# Patient Record
Sex: Female | Born: 1955 | Race: Black or African American | Hispanic: No | Marital: Married | Smoking: Never smoker
Health system: Southern US, Community
[De-identification: ages and names within clinical notes are randomized; demographics above are authoritative.]

## PROBLEM LIST (undated history)

## (undated) DIAGNOSIS — I10 Essential (primary) hypertension: Secondary | ICD-10-CM

## (undated) DIAGNOSIS — E119 Type 2 diabetes mellitus without complications: Secondary | ICD-10-CM

## (undated) HISTORY — PX: ANKLE FRACTURE SURGERY: SHX122

## (undated) HISTORY — PX: DEBRIDEMENT LEG: SUR390

## (undated) HISTORY — PX: ORIF HIP FRACTURE: SHX2125

## (undated) HISTORY — PX: FEMUR FRACTURE SURGERY: SHX633

---

## 2000-10-17 ENCOUNTER — Other Ambulatory Visit: Admission: RE | Admit: 2000-10-17 | Discharge: 2000-10-17 | Payer: Self-pay | Admitting: *Deleted

## 2001-10-23 ENCOUNTER — Other Ambulatory Visit: Admission: RE | Admit: 2001-10-23 | Discharge: 2001-10-23 | Payer: Self-pay | Admitting: Internal Medicine

## 2001-10-29 ENCOUNTER — Encounter: Payer: Self-pay | Admitting: Internal Medicine

## 2001-10-29 ENCOUNTER — Encounter: Admission: RE | Admit: 2001-10-29 | Discharge: 2001-10-29 | Payer: Self-pay | Admitting: Internal Medicine

## 2002-06-18 ENCOUNTER — Encounter: Admission: RE | Admit: 2002-06-18 | Discharge: 2002-09-16 | Payer: Self-pay | Admitting: Internal Medicine

## 2004-05-28 HISTORY — PX: ORIF FOOT FRACTURE: SHX2123

## 2004-05-28 HISTORY — PX: LEG AMPUTATION BELOW KNEE: SHX694

## 2004-10-12 ENCOUNTER — Inpatient Hospital Stay (HOSPITAL_COMMUNITY): Admission: AC | Admit: 2004-10-12 | Discharge: 2004-10-12 | Payer: Self-pay

## 2005-04-30 ENCOUNTER — Encounter: Admission: RE | Admit: 2005-04-30 | Discharge: 2005-07-29 | Payer: Self-pay | Admitting: Orthopaedic Surgery

## 2005-07-30 ENCOUNTER — Encounter: Admission: RE | Admit: 2005-07-30 | Discharge: 2005-10-28 | Payer: Self-pay | Admitting: Orthopaedic Surgery

## 2005-08-15 ENCOUNTER — Ambulatory Visit (HOSPITAL_BASED_OUTPATIENT_CLINIC_OR_DEPARTMENT_OTHER): Admission: RE | Admit: 2005-08-15 | Discharge: 2005-08-15 | Payer: Self-pay | Admitting: Ophthalmology

## 2005-10-08 ENCOUNTER — Encounter: Admission: RE | Admit: 2005-10-08 | Discharge: 2005-10-08 | Payer: Self-pay | Admitting: Internal Medicine

## 2006-02-28 ENCOUNTER — Encounter: Admission: RE | Admit: 2006-02-28 | Discharge: 2006-05-29 | Payer: Self-pay | Admitting: Orthopaedic Surgery
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
## 2006-05-30 ENCOUNTER — Encounter: Admission: RE | Admit: 2006-05-30 | Discharge: 2006-08-28 | Payer: Self-pay | Admitting: Orthopaedic Surgery

## 2006-08-20 ENCOUNTER — Encounter: Admission: RE | Admit: 2006-08-20 | Discharge: 2006-08-20 | Payer: Self-pay | Admitting: Orthopaedic Surgery

## 2006-11-15 ENCOUNTER — Encounter: Admission: RE | Admit: 2006-11-15 | Discharge: 2006-11-15 | Payer: Self-pay | Admitting: Internal Medicine

## 2006-12-24 ENCOUNTER — Emergency Department (HOSPITAL_COMMUNITY): Admission: EM | Admit: 2006-12-24 | Discharge: 2006-12-24 | Payer: Self-pay | Admitting: Family Medicine

## 2008-02-26 ENCOUNTER — Encounter: Admission: RE | Admit: 2008-02-26 | Discharge: 2008-02-26 | Payer: Self-pay | Admitting: Internal Medicine

## 2009-05-02 ENCOUNTER — Ambulatory Visit (HOSPITAL_BASED_OUTPATIENT_CLINIC_OR_DEPARTMENT_OTHER): Admission: RE | Admit: 2009-05-02 | Discharge: 2009-05-02 | Payer: Self-pay | Admitting: Ophthalmology

## 2009-05-18 ENCOUNTER — Encounter: Admission: RE | Admit: 2009-05-18 | Discharge: 2009-05-18 | Payer: Self-pay | Admitting: Internal Medicine

## 2010-09-08 ENCOUNTER — Other Ambulatory Visit: Payer: Self-pay | Admitting: Internal Medicine

## 2010-09-08 DIAGNOSIS — Z1231 Encounter for screening mammogram for malignant neoplasm of breast: Secondary | ICD-10-CM

## 2010-09-14 ENCOUNTER — Ambulatory Visit
Admission: RE | Admit: 2010-09-14 | Discharge: 2010-09-14 | Disposition: A | Discharge status: Home / Self Care | Payer: 59 | Source: Ambulatory Visit | Attending: Internal Medicine | Admitting: Internal Medicine

## 2010-09-14 DIAGNOSIS — Z1231 Encounter for screening mammogram for malignant neoplasm of breast: Secondary | ICD-10-CM

## 2010-10-13 NOTE — Op Note (Signed)
NAME:  Heather Bowen, ALEN NO.:  [id number]   MEDICAL RECORD NO.:  [id number]          PATIENT TYPE:  AMB   LOCATION:  DSC                          FACILITY:  [facility]   PHYSICIAN:  Salley Scarlet., M.D.DATE OF BIRTH:  10/07/1955   DATE OF PROCEDURE:  DATE OF DISCHARGE:  08/15/2005                                 OPERATIVE REPORT   PREOPERATIVE DIAGNOSIS:  Chalazion upper lid left eye.   POSTOPERATIVE DIAGNOSIS:  Chalazion upper lid left eye.   OPERATION:  Chalazion excision.   ANESTHESIA:  Local using Xylocaine 1%.   DESCRIPTION OF PROCEDURE:  The upper lid of the left eye was inspected and  there was a large mass located along the lateral aspect of the upper lid of  the left eye.  The lid was prepped and draped in the usual manner.  The lid  was then infiltrated with several mL's of Xylocaine.  The chalazion clamp  was applied over the lesion and the lid was everted.  A cruciate incission  was made in the tarsus of the of the lid over the lesion and a large amount  of pus expressed itself from the wound.  The lesion was curetted using  chalazion curette.  The sac was incised thoroughly using sharp and blunt  dissection.  Polysporin ophthalmic ointment and a pressure patch was  applied.  The patient tolerated the procedure well and was discharged to the  post anesthesia care unit in satisfactory condition.  She is instructed to  take Vicodin today, to resume her drops tomorrow after she removes the patch  and see me in the office in one week.   DISCHARGE DIAGNOSIS:  Chalazion upper lid, left eye.      Salley Scarlet., M.D.  Electronically Signed     TB/MEDQ  D:  08/17/2005  T:  08/19/2005  Job:  [id number]
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
## 2011-04-17 DIAGNOSIS — M009 Pyogenic arthritis, unspecified: Secondary | ICD-10-CM | POA: Insufficient documentation

## 2011-05-02 DIAGNOSIS — E119 Type 2 diabetes mellitus without complications: Secondary | ICD-10-CM | POA: Insufficient documentation

## 2011-10-18 ENCOUNTER — Other Ambulatory Visit: Payer: Self-pay | Admitting: Internal Medicine

## 2011-10-18 DIAGNOSIS — Z1231 Encounter for screening mammogram for malignant neoplasm of breast: Secondary | ICD-10-CM

## 2011-10-29 ENCOUNTER — Ambulatory Visit
Admission: RE | Admit: 2011-10-29 | Discharge: 2011-10-29 | Disposition: A | Discharge status: Home / Self Care | Payer: 59 | Source: Ambulatory Visit | Attending: Internal Medicine | Admitting: Internal Medicine

## 2011-10-29 DIAGNOSIS — Z1231 Encounter for screening mammogram for malignant neoplasm of breast: Secondary | ICD-10-CM

## 2013-02-20 DIAGNOSIS — M25559 Pain in unspecified hip: Secondary | ICD-10-CM | POA: Insufficient documentation

## 2013-03-12 DIAGNOSIS — M545 Low back pain, unspecified: Secondary | ICD-10-CM | POA: Insufficient documentation

## 2013-03-12 DIAGNOSIS — M5136 Other intervertebral disc degeneration, lumbar region: Secondary | ICD-10-CM | POA: Insufficient documentation

## 2013-11-17 ENCOUNTER — Emergency Department (INDEPENDENT_AMBULATORY_CARE_PROVIDER_SITE_OTHER)
Admission: EM | Admit: 2013-11-17 | Discharge: 2013-11-17 | Disposition: A | Discharge status: Home / Self Care | Payer: 59 | Source: Home / Self Care | Attending: Family Medicine | Admitting: Family Medicine

## 2013-11-17 ENCOUNTER — Encounter (HOSPITAL_COMMUNITY): Payer: Self-pay | Admitting: Emergency Medicine

## 2013-11-17 DIAGNOSIS — J069 Acute upper respiratory infection, unspecified: Secondary | ICD-10-CM

## 2013-11-17 HISTORY — DX: Essential (primary) hypertension: I10

## 2013-11-17 HISTORY — DX: Type 2 diabetes mellitus without complications: E11.9

## 2013-11-17 MED ORDER — DOXYCYCLINE HYCLATE 100 MG PO CAPS: 100.0000 mg | ORAL_CAPSULE | Freq: Two times a day (BID) | ORAL | Status: DC

## 2013-11-17 NOTE — ED Notes (Signed)
C/o aching, sore throat, post nasal drip, dry cough and feels tired onset 2 weeks ago.

## 2013-11-17 NOTE — Discharge Instructions (Signed)
Upper Respiratory Infection, Adult An upper respiratory infection (URI) is also sometimes known as the common cold. The upper respiratory tract includes the nose, sinuses, throat, trachea, and bronchi. Bronchi are the airways leading to the lungs. Most people improve within 1 week, but symptoms can last up to 2 weeks. A residual cough may last even longer.  CAUSES Many different viruses can infect the tissues lining the upper respiratory tract. The tissues become irritated and inflamed and often become very moist. Mucus production is also common. A cold is contagious. You can easily spread the virus to others by oral contact. This includes kissing, sharing a glass, coughing, or sneezing. Touching your mouth or nose and then touching a surface, which is then touched by another person, can also spread the virus. SYMPTOMS  Symptoms typically develop 1 to 3 days after you come in contact with a cold virus. Symptoms vary from person to person. They may include:  Runny nose.  Sneezing.  Nasal congestion.  Sinus irritation.  Sore throat.  Loss of voice (laryngitis).  Cough.  Fatigue.  Muscle aches.  Loss of appetite.  Headache.  Low-grade fever. DIAGNOSIS  You might diagnose your own cold based on familiar symptoms, since most people get a cold 2 to 3 times a year. Your caregiver can confirm this based on your exam. Most importantly, your caregiver can check that your symptoms are not due to another disease such as strep throat, sinusitis, pneumonia, asthma, or epiglottitis. Blood tests, throat tests, and X-rays are not necessary to diagnose a common cold, but they may sometimes be helpful in excluding other more serious diseases. Your caregiver will decide if any further tests are required. RISKS AND COMPLICATIONS  You may be at risk for a more severe case of the common cold if you smoke cigarettes, have chronic heart disease (such as heart failure) or lung disease (such as asthma), or if  you have a weakened immune system. The very young and very old are also at risk for more serious infections. Bacterial sinusitis, middle ear infections, and bacterial pneumonia can complicate the common cold. The common cold can worsen asthma and chronic obstructive pulmonary disease (COPD). Sometimes, these complications can require emergency medical care and may be life-threatening. PREVENTION  The best way to protect against getting a cold is to practice good hygiene. Avoid oral or hand contact with people with cold symptoms. Wash your hands often if contact occurs. There is no clear evidence that vitamin C, vitamin E, echinacea, or exercise reduces the chance of developing a cold. However, it is always recommended to get plenty of rest and practice good nutrition. TREATMENT  Treatment is directed at relieving symptoms. There is no cure. Antibiotics are not effective, because the infection is caused by a virus, not by bacteria. Treatment may include:  Increased fluid intake. Sports drinks offer valuable electrolytes, sugars, and fluids.  Breathing heated mist or steam (vaporizer or shower).  Eating chicken soup or other clear broths, and maintaining good nutrition.  Getting plenty of rest.  Using gargles or lozenges for comfort.  Controlling fevers with ibuprofen or acetaminophen as directed by your caregiver.  Increasing usage of your inhaler if you have asthma. Zinc gel and zinc lozenges, taken in the first 24 hours of the common cold, can shorten the duration and lessen the severity of symptoms. Pain medicines may help with fever, muscle aches, and throat pain. A variety of non-prescription medicines are available to treat congestion and runny nose. Your caregiver   can make recommendations and may suggest nasal or lung inhalers for other symptoms.  HOME CARE INSTRUCTIONS   Only take over-the-counter or prescription medicines for pain, discomfort, or fever as directed by your  caregiver.  Use a warm mist humidifier or inhale steam from a shower to increase air moisture. This may keep secretions moist and make it easier to breathe.  Drink enough water and fluids to keep your urine clear or pale yellow.  Rest as needed.  Return to work when your temperature has returned to normal or as your caregiver advises. You may need to stay home longer to avoid infecting others. You can also use a face mask and careful hand washing to prevent spread of the virus. SEEK MEDICAL CARE IF:   After the first few days, you feel you are getting worse rather than better.  You need your caregiver's advice about medicines to control symptoms.  You develop chills, worsening shortness of breath, or brown or red sputum. These may be signs of pneumonia.  You develop yellow or brown nasal discharge or pain in the face, especially when you bend forward. These may be signs of sinusitis.  You develop a fever, swollen neck glands, pain with swallowing, or white areas in the back of your throat. These may be signs of strep throat. SEEK IMMEDIATE MEDICAL CARE IF:   You have a fever.  You develop severe or persistent headache, ear pain, sinus pain, or chest pain.  You develop wheezing, a prolonged cough, cough up blood, or have a change in your usual mucus (if you have chronic lung disease).  You develop sore muscles or a stiff neck. Document Released: 11/07/2000 Document Revised: 08/06/2011 Document Reviewed: 09/15/2010 Exit[employer] Patient Information 2015 [employer]. This information is not intended to replace advice given to you by your health care provider. Make sure you discuss any questions you have with your health care provider.  
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
## 2013-11-20 NOTE — ED Provider Notes (Signed)
Medical screening examination/treatment/procedure(s) were performed by resident physician or non-physician practitioner and as supervising physician I was immediately available for consultation/collaboration.   KINDL,JAMES DOUGLAS MD.   James D Kindl, MD 11/20/13 1008 

## 2014-04-21 ENCOUNTER — Other Ambulatory Visit: Payer: Self-pay

## 2014-04-21 DIAGNOSIS — Z1231 Encounter for screening mammogram for malignant neoplasm of breast: Secondary | ICD-10-CM

## 2014-05-13 ENCOUNTER — Ambulatory Visit
Admission: RE | Admit: 2014-05-13 | Discharge: 2014-05-13 | Disposition: A | Discharge status: Home / Self Care | Payer: BC Managed Care – PPO | Source: Ambulatory Visit

## 2014-05-13 DIAGNOSIS — Z1231 Encounter for screening mammogram for malignant neoplasm of breast: Secondary | ICD-10-CM

## 2015-02-11 ENCOUNTER — Encounter (HOSPITAL_COMMUNITY): Payer: Self-pay | Admitting: *Deleted

## 2015-02-11 ENCOUNTER — Emergency Department (HOSPITAL_COMMUNITY)
Admission: EM | Admit: 2015-02-11 | Discharge: 2015-02-11 | Disposition: A | Discharge status: Home / Self Care | Payer: BLUE CROSS/BLUE SHIELD | Source: Home / Self Care | Attending: Family Medicine | Admitting: Family Medicine

## 2015-02-11 DIAGNOSIS — L810 Postinflammatory hyperpigmentation: Secondary | ICD-10-CM | POA: Diagnosis not present

## 2015-02-11 DIAGNOSIS — J069 Acute upper respiratory infection, unspecified: Secondary | ICD-10-CM

## 2015-02-11 MED ORDER — IPRATROPIUM BROMIDE 0.06 % NA SOLN: 2.0000 | Freq: Four times a day (QID) | NASAL | Status: DC

## 2015-02-11 MED ORDER — CETIRIZINE HCL 10 MG PO TABS: 10.0000 mg | ORAL_TABLET | Freq: Every day | ORAL | Status: DC

## 2015-02-11 MED ORDER — FLUTICASONE PROPIONATE 0.05 % EX CREA: TOPICAL_CREAM | Freq: Two times a day (BID) | CUTANEOUS | Status: DC
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
## 2015-02-11 NOTE — ED Notes (Signed)
Pt  Reports    Symptoms    Of  A  Knot  On the  l  Arm     X    1  Week       She reports  Some  Drainage  friom the  Affected  Arm     Pt    Reports  Symptoms  Of  Sinus  Drainage         And  Congestion   As  Well        Symptoms  Not releived  By  Charna Elizabeth

## 2015-08-30 DIAGNOSIS — E7800 Pure hypercholesterolemia, unspecified: Secondary | ICD-10-CM | POA: Diagnosis not present

## 2015-08-30 DIAGNOSIS — E1165 Type 2 diabetes mellitus with hyperglycemia: Secondary | ICD-10-CM | POA: Diagnosis not present

## 2015-09-01 DIAGNOSIS — E7800 Pure hypercholesterolemia, unspecified: Secondary | ICD-10-CM | POA: Diagnosis not present

## 2015-09-01 DIAGNOSIS — I10 Essential (primary) hypertension: Secondary | ICD-10-CM | POA: Diagnosis not present

## 2015-09-01 DIAGNOSIS — E1165 Type 2 diabetes mellitus with hyperglycemia: Secondary | ICD-10-CM | POA: Diagnosis not present

## 2015-10-03 DIAGNOSIS — Z8739 Personal history of other diseases of the musculoskeletal system and connective tissue: Secondary | ICD-10-CM | POA: Diagnosis not present

## 2015-10-03 DIAGNOSIS — Z89511 Acquired absence of right leg below knee: Secondary | ICD-10-CM | POA: Diagnosis not present

## 2015-10-03 DIAGNOSIS — Z09 Encounter for follow-up examination after completed treatment for conditions other than malignant neoplasm: Secondary | ICD-10-CM | POA: Diagnosis not present

## 2015-10-13 DIAGNOSIS — Z89511 Acquired absence of right leg below knee: Secondary | ICD-10-CM | POA: Diagnosis not present

## 2015-10-13 DIAGNOSIS — M5136 Other intervertebral disc degeneration, lumbar region: Secondary | ICD-10-CM | POA: Diagnosis not present

## 2015-10-13 DIAGNOSIS — R269 Unspecified abnormalities of gait and mobility: Secondary | ICD-10-CM | POA: Diagnosis not present

## 2015-10-20 ENCOUNTER — Ambulatory Visit: Payer: BLUE CROSS/BLUE SHIELD | Attending: Orthopaedic Surgery | Admitting: Physical Therapy

## 2015-10-20 ENCOUNTER — Encounter: Payer: Self-pay | Admitting: Physical Therapy

## 2015-10-20 DIAGNOSIS — M2560 Stiffness of unspecified joint, not elsewhere classified: Secondary | ICD-10-CM | POA: Diagnosis not present

## 2015-10-20 DIAGNOSIS — R2689 Other abnormalities of gait and mobility: Secondary | ICD-10-CM

## 2015-10-20 DIAGNOSIS — M25661 Stiffness of right knee, not elsewhere classified: Secondary | ICD-10-CM

## 2015-10-20 DIAGNOSIS — M25672 Stiffness of left ankle, not elsewhere classified: Secondary | ICD-10-CM

## 2015-10-20 DIAGNOSIS — M6281 Muscle weakness (generalized): Secondary | ICD-10-CM

## 2015-10-20 DIAGNOSIS — R2681 Unsteadiness on feet: Secondary | ICD-10-CM

## 2015-10-20 DIAGNOSIS — Z1211 Encounter for screening for malignant neoplasm of colon: Secondary | ICD-10-CM | POA: Diagnosis not present

## 2015-10-20 DIAGNOSIS — M2569 Stiffness of other specified joint, not elsewhere classified: Secondary | ICD-10-CM

## 2015-10-20 DIAGNOSIS — K5900 Constipation, unspecified: Secondary | ICD-10-CM | POA: Diagnosis not present

## 2015-10-20 DIAGNOSIS — M25651 Stiffness of right hip, not elsewhere classified: Secondary | ICD-10-CM | POA: Diagnosis not present
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
## 2015-10-26 ENCOUNTER — Encounter: Payer: Self-pay | Admitting: Physical Therapy

## 2015-10-26 ENCOUNTER — Ambulatory Visit: Payer: BLUE CROSS/BLUE SHIELD | Admitting: Physical Therapy

## 2015-10-26 DIAGNOSIS — R2681 Unsteadiness on feet: Secondary | ICD-10-CM | POA: Diagnosis not present

## 2015-10-26 DIAGNOSIS — M25651 Stiffness of right hip, not elsewhere classified: Secondary | ICD-10-CM

## 2015-10-26 DIAGNOSIS — M6281 Muscle weakness (generalized): Secondary | ICD-10-CM | POA: Diagnosis not present

## 2015-10-26 DIAGNOSIS — M25661 Stiffness of right knee, not elsewhere classified: Secondary | ICD-10-CM | POA: Diagnosis not present

## 2015-10-26 DIAGNOSIS — M25672 Stiffness of left ankle, not elsewhere classified: Secondary | ICD-10-CM

## 2015-10-26 DIAGNOSIS — M2569 Stiffness of other specified joint, not elsewhere classified: Secondary | ICD-10-CM

## 2015-10-26 DIAGNOSIS — M2560 Stiffness of unspecified joint, not elsewhere classified: Secondary | ICD-10-CM | POA: Diagnosis not present

## 2015-10-26 DIAGNOSIS — R2689 Other abnormalities of gait and mobility: Secondary | ICD-10-CM | POA: Diagnosis not present
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
## 2015-10-26 NOTE — Patient Instructions (Addendum)
Bridge    Lift hips, keeping pelvis level. Hold for 5 seconds. Do _10_ times, _1-2__ times per day.  http://ss.exer.us/365   Copyright  VHI. All rights reserved.   Lower Torso    Lie supine with knees flexed over ball. Rotate knees to one side, holding onto ball. Hold _15__ seconds. Repeat to other side. Do _1__ sets of _3__ repetitions each way. Copyright  VHI. All rights reserved.  Chair Sitting    Sit at edge of seat, spine straight, one leg extended. Put a hand on each thigh and bend forward from the hip, keeping spine straight. Allow hand on extended leg to reach toward toes. Support upper body with other arm. Hold _20__ seconds. Repeat _3_ times per session. Do _2__ sessions per day.  Copyright  VHI. All rights reserved.   Triangle    Modified: in sitting (on sturdy surface when alone, on stability ball between sofa and coffee table for added core challenge) - prop forearm on same leg, reach up to ceiling with other arm, looking at hand for upper body rotation. Hold for 3 long/slow breaths. Perform x 4-5 reps on each side.  Copyright  VHI. All rights reserved.   ruCalf Stretch    Place one leg forward, bent, other leg behind and straight. Lean forward keeping back heel flat. Hold __20__ seconds while counting out loud. Repeat with other leg forward. Repeat __3__ times each leg forward. Do __1-2__ sessions per day.  http://gt2.exer.us/478   Copyright  VHI. All rights reserved.

## 2015-10-28 ENCOUNTER — Encounter: Payer: Self-pay | Admitting: Physical Therapy

## 2015-10-28 ENCOUNTER — Ambulatory Visit: Payer: BLUE CROSS/BLUE SHIELD | Attending: Orthopaedic Surgery | Admitting: Physical Therapy

## 2015-10-28 DIAGNOSIS — R2681 Unsteadiness on feet: Secondary | ICD-10-CM | POA: Diagnosis not present

## 2015-10-28 DIAGNOSIS — M25661 Stiffness of right knee, not elsewhere classified: Secondary | ICD-10-CM | POA: Diagnosis not present

## 2015-10-28 DIAGNOSIS — M25672 Stiffness of left ankle, not elsewhere classified: Secondary | ICD-10-CM | POA: Insufficient documentation

## 2015-10-28 DIAGNOSIS — M6281 Muscle weakness (generalized): Secondary | ICD-10-CM | POA: Diagnosis not present

## 2015-10-28 DIAGNOSIS — R2689 Other abnormalities of gait and mobility: Secondary | ICD-10-CM | POA: Insufficient documentation

## 2015-10-28 DIAGNOSIS — M25651 Stiffness of right hip, not elsewhere classified: Secondary | ICD-10-CM | POA: Diagnosis not present

## 2015-10-28 DIAGNOSIS — M2560 Stiffness of unspecified joint, not elsewhere classified: Secondary | ICD-10-CM | POA: Insufficient documentation

## 2015-10-28 DIAGNOSIS — M2569 Stiffness of other specified joint, not elsewhere classified: Secondary | ICD-10-CM

## 2015-10-28 NOTE — Patient Instructions (Signed)
HIP: Flexion / KNEE: Extension, Straight Leg Raise    Bend one leg up, then raise straight leg up for 3-4 inches, keeping knee straight. Hold in air for 5 seconds. Slowly lower raised leg back down. 10 times each leg. 1-2 times a day  Copyright  VHI. All rights reserved.    Abduction: Clam (Eccentric) - Side-Lying    Lie on side with knees bent as much as you can with feet together.Have red band tied around legs just above knees. Lift top knee, keeping feet together. Keep trunk steady. Slowly lower for 3-5 seconds. _10_ reps per set, _1_ sets per day,  1-2 times a day  http://ecce.exer.us/65   Copyright  VHI. All rights reserved.   Abduction: Side Leg Lift (Eccentric) - Side-Lying    Lie on side. Lift top leg slightly higher than shoulder level. Keep top leg straight with body, toes pointing forward. Slowly lower for 3-5 seconds. _10__ reps each leg, _1_ sets per day, 1-2 times a day. http://ecce.exer.us/63   Copyright  VHI. All rights reserved.    FLEXION: Sitting (Active)    Sit with right leg extended. Bend knee and draw foot backward (have foot on wax paper so it will slide easier). Once you reach the maximum bend you can do, have someone gently push for more bend or use your left leg over top of right leg to push bend farther.  Hold each bend for 10 seconds. Complete __1_ sets of _10__ repetitions. Perform _1-2__ sessions per day.  Copyright  VHI. All rights reserved.   Chair Knee Flexion    Keeping right foot planted on floor, slide hips forward, bending knee as much possible. Hold __10__ seconds. Repeat __10__ times. Do _1-2_ sessions a day.  http://gt2.exer.us/304   Copyright  VHI. All rights reserved.
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
## 2015-11-01 ENCOUNTER — Ambulatory Visit: Payer: BLUE CROSS/BLUE SHIELD | Admitting: Physical Therapy

## 2015-11-01 DIAGNOSIS — M6281 Muscle weakness (generalized): Secondary | ICD-10-CM

## 2015-11-01 DIAGNOSIS — M25661 Stiffness of right knee, not elsewhere classified: Secondary | ICD-10-CM | POA: Diagnosis not present

## 2015-11-01 DIAGNOSIS — R2681 Unsteadiness on feet: Secondary | ICD-10-CM

## 2015-11-01 DIAGNOSIS — M2560 Stiffness of unspecified joint, not elsewhere classified: Secondary | ICD-10-CM | POA: Diagnosis not present

## 2015-11-01 DIAGNOSIS — M25651 Stiffness of right hip, not elsewhere classified: Secondary | ICD-10-CM | POA: Diagnosis not present

## 2015-11-01 DIAGNOSIS — R2689 Other abnormalities of gait and mobility: Secondary | ICD-10-CM | POA: Diagnosis not present

## 2015-11-01 DIAGNOSIS — M25672 Stiffness of left ankle, not elsewhere classified: Secondary | ICD-10-CM | POA: Diagnosis not present

## 2015-11-01 NOTE — Patient Instructions (Addendum)
Adduction / Internal Rotation: ROM (Supine)    Position (A) Patient: Bend left leg, foot flat on bed. Helper: Stabilize hip. Place other hand on knee. Motion (B) -Push knee toward opposite side of body. -Stop at point of tension. Repeat _2-3__ times. Repeat with other leg. Do _2__ sessions per day. Variation: Can add ball and sheet to help roll   Copyright  VHI. All rights reserved.  Abduction / External Rotation: ROM (Supine)    Position (A) Patient: Bend left leg, foot flat on bed. Helper: Stabilize hip. Place other hand on knee. Motion (B) -Move knee out to side, pelvis remains flat on bed. -Stop at point of tension in inner thigh. Repeat ___ times. Repeat with other leg. Do ___ sessions per day.   Copyright  VHI. All rights reserved.    Hip Flexors (Supine)    Rock back with both knees up, Lie with both legs bent over edge of firm surface. To stretch left hip, bring opposite knee to chest. Apply downward pressure to leg hanging over edge. Do not allow hips to roll up. Do not let knees change position.   Copyright  VHI. All rights reserved.  Hip Abduction / Adduction: with Extended Knee (Supine)    Bring left leg out to side and return. Keep knee straight. Repeat ____ times per set. Do ____ sets per session. Do ____ sessions per day.  http://orth.exer.us/681   Copyright  VHI. All rights reserved.
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
## 2015-11-02 ENCOUNTER — Ambulatory Visit: Payer: BLUE CROSS/BLUE SHIELD | Admitting: Physical Therapy

## 2015-11-02 DIAGNOSIS — R2689 Other abnormalities of gait and mobility: Secondary | ICD-10-CM | POA: Diagnosis not present

## 2015-11-02 DIAGNOSIS — M25661 Stiffness of right knee, not elsewhere classified: Secondary | ICD-10-CM | POA: Diagnosis not present

## 2015-11-02 DIAGNOSIS — M6281 Muscle weakness (generalized): Secondary | ICD-10-CM

## 2015-11-02 DIAGNOSIS — R2681 Unsteadiness on feet: Secondary | ICD-10-CM

## 2015-11-02 DIAGNOSIS — M25651 Stiffness of right hip, not elsewhere classified: Secondary | ICD-10-CM | POA: Diagnosis not present

## 2015-11-02 DIAGNOSIS — M2560 Stiffness of unspecified joint, not elsewhere classified: Secondary | ICD-10-CM | POA: Diagnosis not present

## 2015-11-02 DIAGNOSIS — M25672 Stiffness of left ankle, not elsewhere classified: Secondary | ICD-10-CM | POA: Diagnosis not present
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
## 2015-11-08 ENCOUNTER — Encounter: Payer: Self-pay | Admitting: Physical Therapy

## 2015-11-08 ENCOUNTER — Ambulatory Visit: Payer: BLUE CROSS/BLUE SHIELD | Admitting: Physical Therapy

## 2015-11-08 DIAGNOSIS — M25651 Stiffness of right hip, not elsewhere classified: Secondary | ICD-10-CM | POA: Diagnosis not present

## 2015-11-08 DIAGNOSIS — M6281 Muscle weakness (generalized): Secondary | ICD-10-CM

## 2015-11-08 DIAGNOSIS — M2560 Stiffness of unspecified joint, not elsewhere classified: Secondary | ICD-10-CM

## 2015-11-08 DIAGNOSIS — M2569 Stiffness of other specified joint, not elsewhere classified: Secondary | ICD-10-CM

## 2015-11-08 DIAGNOSIS — R2681 Unsteadiness on feet: Secondary | ICD-10-CM

## 2015-11-08 DIAGNOSIS — M25672 Stiffness of left ankle, not elsewhere classified: Secondary | ICD-10-CM | POA: Diagnosis not present

## 2015-11-08 DIAGNOSIS — M25661 Stiffness of right knee, not elsewhere classified: Secondary | ICD-10-CM

## 2015-11-08 DIAGNOSIS — R2689 Other abnormalities of gait and mobility: Secondary | ICD-10-CM

## 2015-11-10 ENCOUNTER — Ambulatory Visit: Payer: BLUE CROSS/BLUE SHIELD | Admitting: Physical Therapy

## 2015-11-10 DIAGNOSIS — M25661 Stiffness of right knee, not elsewhere classified: Secondary | ICD-10-CM | POA: Diagnosis not present

## 2015-11-10 DIAGNOSIS — M25672 Stiffness of left ankle, not elsewhere classified: Secondary | ICD-10-CM | POA: Diagnosis not present

## 2015-11-10 DIAGNOSIS — R2689 Other abnormalities of gait and mobility: Secondary | ICD-10-CM | POA: Diagnosis not present

## 2015-11-10 DIAGNOSIS — R2681 Unsteadiness on feet: Secondary | ICD-10-CM

## 2015-11-10 DIAGNOSIS — M2560 Stiffness of unspecified joint, not elsewhere classified: Secondary | ICD-10-CM | POA: Diagnosis not present

## 2015-11-10 DIAGNOSIS — M6281 Muscle weakness (generalized): Secondary | ICD-10-CM | POA: Diagnosis not present

## 2015-11-10 DIAGNOSIS — M25651 Stiffness of right hip, not elsewhere classified: Secondary | ICD-10-CM | POA: Diagnosis not present
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
## 2015-11-15 ENCOUNTER — Ambulatory Visit: Payer: BLUE CROSS/BLUE SHIELD | Admitting: Physical Therapy

## 2015-11-15 ENCOUNTER — Encounter: Payer: Self-pay | Admitting: Physical Therapy

## 2015-11-15 DIAGNOSIS — R2681 Unsteadiness on feet: Secondary | ICD-10-CM | POA: Diagnosis not present

## 2015-11-15 DIAGNOSIS — M25651 Stiffness of right hip, not elsewhere classified: Secondary | ICD-10-CM | POA: Diagnosis not present

## 2015-11-15 DIAGNOSIS — M2560 Stiffness of unspecified joint, not elsewhere classified: Secondary | ICD-10-CM

## 2015-11-15 DIAGNOSIS — R2689 Other abnormalities of gait and mobility: Secondary | ICD-10-CM

## 2015-11-15 DIAGNOSIS — M2569 Stiffness of other specified joint, not elsewhere classified: Secondary | ICD-10-CM

## 2015-11-15 DIAGNOSIS — M25661 Stiffness of right knee, not elsewhere classified: Secondary | ICD-10-CM

## 2015-11-15 DIAGNOSIS — M6281 Muscle weakness (generalized): Secondary | ICD-10-CM | POA: Diagnosis not present

## 2015-11-15 DIAGNOSIS — M25672 Stiffness of left ankle, not elsewhere classified: Secondary | ICD-10-CM | POA: Diagnosis not present
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
## 2015-11-17 ENCOUNTER — Ambulatory Visit: Payer: BLUE CROSS/BLUE SHIELD | Admitting: Physical Therapy

## 2015-11-17 DIAGNOSIS — M25661 Stiffness of right knee, not elsewhere classified: Secondary | ICD-10-CM | POA: Diagnosis not present

## 2015-11-17 DIAGNOSIS — M2560 Stiffness of unspecified joint, not elsewhere classified: Secondary | ICD-10-CM | POA: Diagnosis not present

## 2015-11-17 DIAGNOSIS — R2681 Unsteadiness on feet: Secondary | ICD-10-CM | POA: Diagnosis not present

## 2015-11-17 DIAGNOSIS — R2689 Other abnormalities of gait and mobility: Secondary | ICD-10-CM | POA: Diagnosis not present

## 2015-11-17 DIAGNOSIS — M6281 Muscle weakness (generalized): Secondary | ICD-10-CM | POA: Diagnosis not present

## 2015-11-17 DIAGNOSIS — Z89511 Acquired absence of right leg below knee: Secondary | ICD-10-CM | POA: Diagnosis not present

## 2015-11-17 DIAGNOSIS — M25651 Stiffness of right hip, not elsewhere classified: Secondary | ICD-10-CM | POA: Diagnosis not present

## 2015-11-17 DIAGNOSIS — M25672 Stiffness of left ankle, not elsewhere classified: Secondary | ICD-10-CM | POA: Diagnosis not present

## 2015-11-17 NOTE — Patient Instructions (Signed)
Feet Together (Compliant Surface) Head Motion - Eyes Open    With EYES  OPEN, standing on compliant surface: feet together, move head slowly: up and down. Repeat _2_ times per session. Do _3__ sessions per day.  Copyright  VHI. All rights reserved.  Feet Together, Head Motion - Eyes Closed    With eyes closed and feet together, move head slowly, up and down. Repeat 2 times per session. Do 3 sessions per day.  Copyright  VHI. All rights reserved.  Hip Abduction (Standing)    Stand with support at counter. Alternate legs. Lift right leg out to side, keeping toe forward.  Repeat ___ times. Do ___ times a day. Repeat with other leg. Add ___ lb weight.   Copyright  VHI. All rights reserved.  Hip Flexion, Knee Straight    Due at counter, alternate legs. Lift right straight leg forward and up. Repeat 2 times per session. Do 3 sessions per week.  Copyright  VHI. All rights reserved.
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
## 2015-11-22 ENCOUNTER — Ambulatory Visit: Payer: BLUE CROSS/BLUE SHIELD | Admitting: Physical Therapy

## 2015-11-22 DIAGNOSIS — M6281 Muscle weakness (generalized): Secondary | ICD-10-CM | POA: Diagnosis not present

## 2015-11-22 DIAGNOSIS — M2560 Stiffness of unspecified joint, not elsewhere classified: Secondary | ICD-10-CM | POA: Diagnosis not present

## 2015-11-22 DIAGNOSIS — M25651 Stiffness of right hip, not elsewhere classified: Secondary | ICD-10-CM | POA: Diagnosis not present

## 2015-11-22 DIAGNOSIS — R2689 Other abnormalities of gait and mobility: Secondary | ICD-10-CM | POA: Diagnosis not present

## 2015-11-22 DIAGNOSIS — R2681 Unsteadiness on feet: Secondary | ICD-10-CM | POA: Diagnosis not present

## 2015-11-22 DIAGNOSIS — M25661 Stiffness of right knee, not elsewhere classified: Secondary | ICD-10-CM | POA: Diagnosis not present

## 2015-11-22 DIAGNOSIS — M25672 Stiffness of left ankle, not elsewhere classified: Secondary | ICD-10-CM | POA: Diagnosis not present

## 2015-11-22 NOTE — Patient Instructions (Addendum)
   Stand with support at counter.prosthetic side, tap out to side. Non prosthetic side, lift out to the side, alternate legs     Copyright  VHI. All rights reserved.  Hip Flexion, Knee Straight    Due at counter, prosthesis closest to counter.  Do forwards and backwards! Kick non prosthetic side, tap prosthesis. Alternate legs  Lift right straight leg forward and up. Repeat 2 times per session. Do 3 sessions per week.
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
## 2015-11-24 ENCOUNTER — Encounter: Payer: BLUE CROSS/BLUE SHIELD | Admitting: Physical Therapy

## 2015-11-30 ENCOUNTER — Ambulatory Visit: Payer: BLUE CROSS/BLUE SHIELD | Attending: Orthopaedic Surgery | Admitting: Physical Therapy

## 2015-11-30 ENCOUNTER — Encounter: Payer: Self-pay | Admitting: Physical Therapy

## 2015-11-30 DIAGNOSIS — M6281 Muscle weakness (generalized): Secondary | ICD-10-CM | POA: Diagnosis not present

## 2015-11-30 DIAGNOSIS — M25651 Stiffness of right hip, not elsewhere classified: Secondary | ICD-10-CM

## 2015-11-30 DIAGNOSIS — M25661 Stiffness of right knee, not elsewhere classified: Secondary | ICD-10-CM | POA: Diagnosis not present

## 2015-11-30 DIAGNOSIS — M2569 Stiffness of other specified joint, not elsewhere classified: Secondary | ICD-10-CM

## 2015-11-30 DIAGNOSIS — R2681 Unsteadiness on feet: Secondary | ICD-10-CM | POA: Diagnosis not present

## 2015-11-30 DIAGNOSIS — R2689 Other abnormalities of gait and mobility: Secondary | ICD-10-CM | POA: Insufficient documentation

## 2015-11-30 DIAGNOSIS — M2560 Stiffness of unspecified joint, not elsewhere classified: Secondary | ICD-10-CM | POA: Diagnosis not present

## 2015-11-30 NOTE — Patient Instructions (Signed)
1)  Lunge Stretch    Step into deep forward lunge, hands on thigh, knee lightly touching floor. Push back leg straight. Do not allow front knee past line of toes. Hold for ____ breaths. Repeat on other side. ADVANCED: Arms reaching up, arch back slightly.  Copyright  VHI. All rights reserved.    2)  Triangle Pose (Narrow stance):  Stand with a cabinet behind you and a chair positioned in front of you with the seat facing you. Keep your feet positioned about shoulder width apart and lean forward with your elbows on the seat of the chair in front of you. Slowly lift your left arm up to the ceiling by twisting at your waist and turning your head up and to the left. Keep your right elbow positioned on the seat of the chair. Try to turn twist at the waist and turn your head up and to the left to view your left hand held up in the air toward the ceiling. Feel yourself holding this position for 20-30 seconds. Repeat these steps with the other side of your body, turning to the right.  3)  Triangle Pose (Wide stance):  Stand with a cabinet behind you and a chair positioned in front of you with the seat facing you. Spread your feet apart wider than shoulder width and lean forward with your elbows on the seat of the chair in front of you. Slowly lift your left arm up to the ceiling by twisting at your waist and turning your head up and to the left. Keep your right elbow positioned on the seat of the chair. Try to turn twist at the waist and turn your head up and to the left to view your left hand held up in the air toward the ceiling. Feel yourself holding this position for 20-30 seconds. Repeat these steps with the other side of your body, turning to the right.  4) Warrior II    In wide stance, arms extended out, rotate right leg out 90, left leg in 20. Bend right leg 90 in line with foot. Keep left foot flat, hips square to front. Turn head right. Hold for ____ breaths. Repeat on other  side. NOTE: Keep bent knee behind line of toes.   http://yg.exer.us/17   Copyright  VHI. All rights reserved.    5) Braiding    Move to side: 1) cross right leg in front of left, 2) bring back leg out to side, then 3) cross right leg behind left, 4) bring left leg out to side. Continue sequence in same direction. Reverse sequence, moving in opposite direction. Repeat sequence ____ times per session. Do ____ sessions per day. Repeat on compliant surface: ________.  Copyright  VHI. All rights reserved.    6) Bracing With Arm / Leg Raise (Quadruped)    On hands and knees find neutral spine. Tighten pelvic floor and abdominals and hold. Alternating, lift arm to shoulder level and opposite leg to hip level. Repeat ___ times. Do ___ times a day.   Copyright  VHI. All rights reserved.    7)  Side-Sitting to Quadruped    Helper may place knee on bed to improve comfort. Keep spine straight and abdominal muscles taut. - Helper supports at shoulder as patient places hands on bed or mat, shoulder width apart.(A) - Cue patient to lift hips off mat, helper guides with hands or gait belt.(B) - Adjust knees to hip width and place hands for comfort and stability.(C) Reverse procedure  to return to side-sitting.  Copyright  VHI. All rights reserved.    8)  Seated, belt-assisted Quad stretch  Sit in a straight back chair Place belt around front of lower right leg/shin Lean over to the left side/hip Use the belt to help yourself bend your right knee as much as possible Once you get your right knee bent as much as possible, plant your right foot on the floor Lean back over to your right side/hip You should feel a good stretch in the front of your right thigh Repeat these steps on the left leg.
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
## 2015-12-07 ENCOUNTER — Ambulatory Visit: Payer: BLUE CROSS/BLUE SHIELD | Admitting: Physical Therapy

## 2015-12-07 ENCOUNTER — Encounter: Payer: Self-pay | Admitting: Physical Therapy

## 2015-12-07 DIAGNOSIS — R2689 Other abnormalities of gait and mobility: Secondary | ICD-10-CM | POA: Diagnosis not present

## 2015-12-07 DIAGNOSIS — M25651 Stiffness of right hip, not elsewhere classified: Secondary | ICD-10-CM

## 2015-12-07 DIAGNOSIS — R2681 Unsteadiness on feet: Secondary | ICD-10-CM | POA: Diagnosis not present

## 2015-12-07 DIAGNOSIS — M25661 Stiffness of right knee, not elsewhere classified: Secondary | ICD-10-CM | POA: Diagnosis not present

## 2015-12-07 DIAGNOSIS — M6281 Muscle weakness (generalized): Secondary | ICD-10-CM

## 2015-12-07 DIAGNOSIS — M2569 Stiffness of other specified joint, not elsewhere classified: Secondary | ICD-10-CM

## 2015-12-07 DIAGNOSIS — M2560 Stiffness of unspecified joint, not elsewhere classified: Secondary | ICD-10-CM | POA: Diagnosis not present
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
## 2015-12-14 ENCOUNTER — Encounter: Payer: Self-pay | Admitting: Physical Therapy

## 2015-12-14 ENCOUNTER — Ambulatory Visit: Payer: BLUE CROSS/BLUE SHIELD | Admitting: Physical Therapy

## 2015-12-14 DIAGNOSIS — M6281 Muscle weakness (generalized): Secondary | ICD-10-CM

## 2015-12-14 DIAGNOSIS — M25651 Stiffness of right hip, not elsewhere classified: Secondary | ICD-10-CM

## 2015-12-14 DIAGNOSIS — M2560 Stiffness of unspecified joint, not elsewhere classified: Secondary | ICD-10-CM

## 2015-12-14 DIAGNOSIS — R2681 Unsteadiness on feet: Secondary | ICD-10-CM | POA: Diagnosis not present

## 2015-12-14 DIAGNOSIS — R2689 Other abnormalities of gait and mobility: Secondary | ICD-10-CM

## 2015-12-14 DIAGNOSIS — M25661 Stiffness of right knee, not elsewhere classified: Secondary | ICD-10-CM | POA: Diagnosis not present

## 2015-12-14 DIAGNOSIS — M2569 Stiffness of other specified joint, not elsewhere classified: Secondary | ICD-10-CM
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
## 2015-12-21 ENCOUNTER — Ambulatory Visit: Payer: BLUE CROSS/BLUE SHIELD | Admitting: Physical Therapy

## 2015-12-21 ENCOUNTER — Encounter: Payer: Self-pay | Admitting: Physical Therapy

## 2015-12-21 DIAGNOSIS — M2560 Stiffness of unspecified joint, not elsewhere classified: Secondary | ICD-10-CM | POA: Diagnosis not present

## 2015-12-21 DIAGNOSIS — R2681 Unsteadiness on feet: Secondary | ICD-10-CM

## 2015-12-21 DIAGNOSIS — R2689 Other abnormalities of gait and mobility: Secondary | ICD-10-CM | POA: Diagnosis not present

## 2015-12-21 DIAGNOSIS — M6281 Muscle weakness (generalized): Secondary | ICD-10-CM

## 2015-12-21 DIAGNOSIS — M25651 Stiffness of right hip, not elsewhere classified: Secondary | ICD-10-CM | POA: Diagnosis not present

## 2015-12-21 DIAGNOSIS — M25661 Stiffness of right knee, not elsewhere classified: Secondary | ICD-10-CM | POA: Diagnosis not present
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
## 2015-12-23 DIAGNOSIS — Z1211 Encounter for screening for malignant neoplasm of colon: Secondary | ICD-10-CM | POA: Diagnosis not present

## 2015-12-23 DIAGNOSIS — K5730 Diverticulosis of large intestine without perforation or abscess without bleeding: Secondary | ICD-10-CM | POA: Diagnosis not present

## 2015-12-23 DIAGNOSIS — K621 Rectal polyp: Secondary | ICD-10-CM | POA: Diagnosis not present

## 2015-12-23 DIAGNOSIS — K635 Polyp of colon: Secondary | ICD-10-CM | POA: Diagnosis not present

## 2016-01-09 DIAGNOSIS — E1165 Type 2 diabetes mellitus with hyperglycemia: Secondary | ICD-10-CM | POA: Diagnosis not present

## 2016-01-09 DIAGNOSIS — E7800 Pure hypercholesterolemia, unspecified: Secondary | ICD-10-CM | POA: Diagnosis not present

## 2016-01-16 DIAGNOSIS — I10 Essential (primary) hypertension: Secondary | ICD-10-CM | POA: Diagnosis not present

## 2016-01-16 DIAGNOSIS — E669 Obesity, unspecified: Secondary | ICD-10-CM | POA: Diagnosis not present

## 2016-01-16 DIAGNOSIS — E1165 Type 2 diabetes mellitus with hyperglycemia: Secondary | ICD-10-CM | POA: Diagnosis not present

## 2016-01-16 DIAGNOSIS — E7800 Pure hypercholesterolemia, unspecified: Secondary | ICD-10-CM | POA: Diagnosis not present

## 2016-03-16 DIAGNOSIS — E785 Hyperlipidemia, unspecified: Secondary | ICD-10-CM | POA: Diagnosis not present

## 2016-03-16 DIAGNOSIS — Z0000 Encounter for general adult medical examination without abnormal findings: Secondary | ICD-10-CM | POA: Diagnosis not present

## 2016-03-16 DIAGNOSIS — Z0001 Encounter for general adult medical examination with abnormal findings: Secondary | ICD-10-CM | POA: Diagnosis not present

## 2016-03-16 DIAGNOSIS — E1165 Type 2 diabetes mellitus with hyperglycemia: Secondary | ICD-10-CM | POA: Diagnosis not present

## 2016-03-16 DIAGNOSIS — N390 Urinary tract infection, site not specified: Secondary | ICD-10-CM | POA: Diagnosis not present

## 2016-03-16 DIAGNOSIS — Z89511 Acquired absence of right leg below knee: Secondary | ICD-10-CM | POA: Diagnosis not present

## 2016-03-16 DIAGNOSIS — I10 Essential (primary) hypertension: Secondary | ICD-10-CM | POA: Diagnosis not present

## 2016-03-16 DIAGNOSIS — R8290 Unspecified abnormal findings in urine: Secondary | ICD-10-CM | POA: Diagnosis not present

## 2016-03-16 DIAGNOSIS — Z23 Encounter for immunization: Secondary | ICD-10-CM | POA: Diagnosis not present

## 2016-07-26 DIAGNOSIS — E669 Obesity, unspecified: Secondary | ICD-10-CM | POA: Diagnosis not present

## 2016-07-26 DIAGNOSIS — I10 Essential (primary) hypertension: Secondary | ICD-10-CM | POA: Diagnosis not present

## 2016-07-26 DIAGNOSIS — E7800 Pure hypercholesterolemia, unspecified: Secondary | ICD-10-CM | POA: Diagnosis not present

## 2016-07-26 DIAGNOSIS — E1165 Type 2 diabetes mellitus with hyperglycemia: Secondary | ICD-10-CM | POA: Diagnosis not present

## 2016-08-02 DIAGNOSIS — E1165 Type 2 diabetes mellitus with hyperglycemia: Secondary | ICD-10-CM | POA: Diagnosis not present

## 2016-09-04 ENCOUNTER — Encounter (HOSPITAL_COMMUNITY): Payer: Self-pay | Admitting: Emergency Medicine

## 2016-09-04 ENCOUNTER — Ambulatory Visit (HOSPITAL_COMMUNITY)
Admission: EM | Admit: 2016-09-04 | Discharge: 2016-09-04 | Disposition: A | Discharge status: Home / Self Care | Payer: BLUE CROSS/BLUE SHIELD | Attending: Internal Medicine | Admitting: Internal Medicine

## 2016-09-04 DIAGNOSIS — J208 Acute bronchitis due to other specified organisms: Secondary | ICD-10-CM | POA: Diagnosis not present

## 2016-09-04 DIAGNOSIS — J209 Acute bronchitis, unspecified: Secondary | ICD-10-CM

## 2016-09-04 MED ORDER — PREDNISONE 50 MG PO TABS: 50.0000 mg | ORAL_TABLET | Freq: Every day | ORAL | 0 refills | Status: DC

## 2016-09-04 MED ORDER — ALBUTEROL SULFATE HFA 108 (90 BASE) MCG/ACT IN AERS: 2.0000 | INHALATION_SPRAY | RESPIRATORY_TRACT | 0 refills | Status: DC | PRN

## 2016-09-04 MED ORDER — BENZONATATE 200 MG PO CAPS: 200.0000 mg | ORAL_CAPSULE | Freq: Three times a day (TID) | ORAL | 1 refills | Status: DC | PRN
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
## 2016-09-04 NOTE — ED Triage Notes (Signed)
The patient presented to the [facility] with a complaint of a cough x 2 weeks. 

## 2016-09-04 NOTE — Discharge Instructions (Addendum)
Airways sound a little inflamed, consistent with acute bronchitis.  No specific treatment is available, but often a combination of medicines is helpful in managing cough: prednisone, albuterol inhaler, and benzonatate cough perles.  Recheck if new fever >100.5, increasing phlegm production/nasal discharge, or if not starting to improve in the next several days.  Cough may take a couple more weeks to subside.

## 2016-09-04 NOTE — ED Notes (Signed)
Patient discharged by provider.

## 2016-12-24 DIAGNOSIS — E7800 Pure hypercholesterolemia, unspecified: Secondary | ICD-10-CM | POA: Diagnosis not present

## 2016-12-24 DIAGNOSIS — I10 Essential (primary) hypertension: Secondary | ICD-10-CM | POA: Diagnosis not present

## 2016-12-24 DIAGNOSIS — Z89511 Acquired absence of right leg below knee: Secondary | ICD-10-CM | POA: Diagnosis not present

## 2016-12-24 DIAGNOSIS — E1165 Type 2 diabetes mellitus with hyperglycemia: Secondary | ICD-10-CM | POA: Diagnosis not present

## 2016-12-24 DIAGNOSIS — E669 Obesity, unspecified: Secondary | ICD-10-CM | POA: Diagnosis not present

## 2017-04-29 DIAGNOSIS — E1165 Type 2 diabetes mellitus with hyperglycemia: Secondary | ICD-10-CM | POA: Diagnosis not present

## 2017-04-29 DIAGNOSIS — I10 Essential (primary) hypertension: Secondary | ICD-10-CM | POA: Diagnosis not present

## 2017-04-29 DIAGNOSIS — E669 Obesity, unspecified: Secondary | ICD-10-CM | POA: Diagnosis not present

## 2017-04-29 DIAGNOSIS — E7800 Pure hypercholesterolemia, unspecified: Secondary | ICD-10-CM | POA: Diagnosis not present

## 2017-07-30 DIAGNOSIS — E1165 Type 2 diabetes mellitus with hyperglycemia: Secondary | ICD-10-CM | POA: Diagnosis not present

## 2017-07-30 DIAGNOSIS — E7800 Pure hypercholesterolemia, unspecified: Secondary | ICD-10-CM | POA: Diagnosis not present

## 2017-08-05 DIAGNOSIS — E1165 Type 2 diabetes mellitus with hyperglycemia: Secondary | ICD-10-CM | POA: Diagnosis not present

## 2017-08-05 DIAGNOSIS — E669 Obesity, unspecified: Secondary | ICD-10-CM | POA: Diagnosis not present

## 2017-08-05 DIAGNOSIS — E7800 Pure hypercholesterolemia, unspecified: Secondary | ICD-10-CM | POA: Diagnosis not present

## 2017-08-05 DIAGNOSIS — I10 Essential (primary) hypertension: Secondary | ICD-10-CM | POA: Diagnosis not present

## 2018-01-01 DIAGNOSIS — E1165 Type 2 diabetes mellitus with hyperglycemia: Secondary | ICD-10-CM | POA: Diagnosis not present

## 2018-01-01 DIAGNOSIS — R809 Proteinuria, unspecified: Secondary | ICD-10-CM | POA: Diagnosis not present

## 2018-01-08 DIAGNOSIS — E669 Obesity, unspecified: Secondary | ICD-10-CM | POA: Diagnosis not present

## 2018-01-08 DIAGNOSIS — E1165 Type 2 diabetes mellitus with hyperglycemia: Secondary | ICD-10-CM | POA: Diagnosis not present

## 2018-01-08 DIAGNOSIS — E7800 Pure hypercholesterolemia, unspecified: Secondary | ICD-10-CM | POA: Diagnosis not present

## 2018-01-08 DIAGNOSIS — I10 Essential (primary) hypertension: Secondary | ICD-10-CM | POA: Diagnosis not present

## 2019-01-06 ENCOUNTER — Other Ambulatory Visit: Payer: Self-pay | Admitting: Nurse Practitioner

## 2019-01-06 ENCOUNTER — Other Ambulatory Visit: Payer: Self-pay | Admitting: Internal Medicine

## 2019-01-06 DIAGNOSIS — Z1231 Encounter for screening mammogram for malignant neoplasm of breast: Secondary | ICD-10-CM

## 2019-01-13 ENCOUNTER — Other Ambulatory Visit: Payer: Self-pay

## 2019-01-13 ENCOUNTER — Ambulatory Visit
Admission: RE | Admit: 2019-01-13 | Discharge: 2019-01-13 | Disposition: A | Discharge status: Home / Self Care | Payer: 59 | Source: Ambulatory Visit | Attending: Nurse Practitioner | Admitting: Nurse Practitioner

## 2019-01-13 DIAGNOSIS — Z1231 Encounter for screening mammogram for malignant neoplasm of breast: Secondary | ICD-10-CM

## 2019-01-30 DIAGNOSIS — Z8781 Personal history of (healed) traumatic fracture: Secondary | ICD-10-CM | POA: Insufficient documentation

## 2019-02-17 ENCOUNTER — Other Ambulatory Visit: Payer: Self-pay

## 2019-02-17 ENCOUNTER — Encounter (INDEPENDENT_AMBULATORY_CARE_PROVIDER_SITE_OTHER): Payer: 59 | Admitting: Ophthalmology

## 2019-02-17 DIAGNOSIS — I10 Essential (primary) hypertension: Secondary | ICD-10-CM

## 2019-02-17 DIAGNOSIS — E113313 Type 2 diabetes mellitus with moderate nonproliferative diabetic retinopathy with macular edema, bilateral: Secondary | ICD-10-CM

## 2019-02-17 DIAGNOSIS — H43813 Vitreous degeneration, bilateral: Secondary | ICD-10-CM

## 2019-02-17 DIAGNOSIS — E11311 Type 2 diabetes mellitus with unspecified diabetic retinopathy with macular edema: Secondary | ICD-10-CM | POA: Diagnosis not present

## 2019-02-17 DIAGNOSIS — H35033 Hypertensive retinopathy, bilateral: Secondary | ICD-10-CM

## 2019-03-17 ENCOUNTER — Encounter (INDEPENDENT_AMBULATORY_CARE_PROVIDER_SITE_OTHER): Payer: 59 | Admitting: Ophthalmology

## 2019-03-17 ENCOUNTER — Other Ambulatory Visit: Payer: Self-pay

## 2019-03-17 DIAGNOSIS — E113313 Type 2 diabetes mellitus with moderate nonproliferative diabetic retinopathy with macular edema, bilateral: Secondary | ICD-10-CM | POA: Diagnosis not present

## 2019-03-17 DIAGNOSIS — E11311 Type 2 diabetes mellitus with unspecified diabetic retinopathy with macular edema: Secondary | ICD-10-CM

## 2019-03-17 DIAGNOSIS — I10 Essential (primary) hypertension: Secondary | ICD-10-CM | POA: Diagnosis not present

## 2019-03-17 DIAGNOSIS — H43813 Vitreous degeneration, bilateral: Secondary | ICD-10-CM

## 2019-03-17 DIAGNOSIS — H35033 Hypertensive retinopathy, bilateral: Secondary | ICD-10-CM

## 2019-04-14 ENCOUNTER — Encounter (INDEPENDENT_AMBULATORY_CARE_PROVIDER_SITE_OTHER): Payer: BC Managed Care – PPO | Admitting: Ophthalmology

## 2019-04-14 DIAGNOSIS — E11311 Type 2 diabetes mellitus with unspecified diabetic retinopathy with macular edema: Secondary | ICD-10-CM

## 2019-04-14 DIAGNOSIS — E113313 Type 2 diabetes mellitus with moderate nonproliferative diabetic retinopathy with macular edema, bilateral: Secondary | ICD-10-CM

## 2019-04-14 DIAGNOSIS — H43813 Vitreous degeneration, bilateral: Secondary | ICD-10-CM

## 2019-04-14 DIAGNOSIS — H35033 Hypertensive retinopathy, bilateral: Secondary | ICD-10-CM

## 2019-04-14 DIAGNOSIS — I10 Essential (primary) hypertension: Secondary | ICD-10-CM

## 2019-04-14 DIAGNOSIS — H2513 Age-related nuclear cataract, bilateral: Secondary | ICD-10-CM

## 2019-04-30 DIAGNOSIS — H40023 Open angle with borderline findings, high risk, bilateral: Secondary | ICD-10-CM | POA: Diagnosis not present

## 2019-05-12 ENCOUNTER — Encounter (INDEPENDENT_AMBULATORY_CARE_PROVIDER_SITE_OTHER): Payer: BC Managed Care – PPO | Admitting: Ophthalmology

## 2019-05-12 ENCOUNTER — Other Ambulatory Visit: Payer: Self-pay

## 2019-05-12 DIAGNOSIS — E11311 Type 2 diabetes mellitus with unspecified diabetic retinopathy with macular edema: Secondary | ICD-10-CM

## 2019-05-12 DIAGNOSIS — E113313 Type 2 diabetes mellitus with moderate nonproliferative diabetic retinopathy with macular edema, bilateral: Secondary | ICD-10-CM

## 2019-05-12 DIAGNOSIS — H43813 Vitreous degeneration, bilateral: Secondary | ICD-10-CM

## 2019-05-12 DIAGNOSIS — I10 Essential (primary) hypertension: Secondary | ICD-10-CM

## 2019-05-12 DIAGNOSIS — H2513 Age-related nuclear cataract, bilateral: Secondary | ICD-10-CM

## 2019-05-12 DIAGNOSIS — H35033 Hypertensive retinopathy, bilateral: Secondary | ICD-10-CM | POA: Diagnosis not present

## 2019-05-14 DIAGNOSIS — H401131 Primary open-angle glaucoma, bilateral, mild stage: Secondary | ICD-10-CM | POA: Diagnosis not present

## 2019-06-09 ENCOUNTER — Other Ambulatory Visit: Payer: Self-pay

## 2019-06-09 ENCOUNTER — Encounter (INDEPENDENT_AMBULATORY_CARE_PROVIDER_SITE_OTHER): Payer: BC Managed Care – PPO | Admitting: Ophthalmology

## 2019-06-09 DIAGNOSIS — H43813 Vitreous degeneration, bilateral: Secondary | ICD-10-CM

## 2019-06-09 DIAGNOSIS — H35033 Hypertensive retinopathy, bilateral: Secondary | ICD-10-CM | POA: Diagnosis not present

## 2019-06-09 DIAGNOSIS — I10 Essential (primary) hypertension: Secondary | ICD-10-CM

## 2019-06-09 DIAGNOSIS — E11311 Type 2 diabetes mellitus with unspecified diabetic retinopathy with macular edema: Secondary | ICD-10-CM

## 2019-06-09 DIAGNOSIS — E113313 Type 2 diabetes mellitus with moderate nonproliferative diabetic retinopathy with macular edema, bilateral: Secondary | ICD-10-CM | POA: Diagnosis not present

## 2019-06-12 ENCOUNTER — Ambulatory Visit: Payer: 59 | Admitting: Podiatry

## 2019-06-16 ENCOUNTER — Encounter: Payer: Self-pay | Admitting: Podiatry

## 2019-06-16 ENCOUNTER — Other Ambulatory Visit: Payer: Self-pay

## 2019-06-16 ENCOUNTER — Ambulatory Visit: Payer: 59 | Admitting: Podiatry

## 2019-06-16 DIAGNOSIS — M79674 Pain in right toe(s): Secondary | ICD-10-CM

## 2019-06-16 DIAGNOSIS — M79675 Pain in left toe(s): Secondary | ICD-10-CM

## 2019-06-16 DIAGNOSIS — Z89511 Acquired absence of right leg below knee: Secondary | ICD-10-CM

## 2019-06-16 DIAGNOSIS — B351 Tinea unguium: Secondary | ICD-10-CM
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
## 2019-06-17 DIAGNOSIS — Z794 Long term (current) use of insulin: Secondary | ICD-10-CM | POA: Diagnosis not present

## 2019-06-17 DIAGNOSIS — E1165 Type 2 diabetes mellitus with hyperglycemia: Secondary | ICD-10-CM | POA: Diagnosis not present

## 2019-06-17 DIAGNOSIS — Z89511 Acquired absence of right leg below knee: Secondary | ICD-10-CM | POA: Diagnosis not present

## 2019-06-17 DIAGNOSIS — I10 Essential (primary) hypertension: Secondary | ICD-10-CM | POA: Diagnosis not present

## 2019-06-27 ENCOUNTER — Other Ambulatory Visit: Payer: Self-pay

## 2019-06-27 DIAGNOSIS — Z20822 Contact with and (suspected) exposure to covid-19: Secondary | ICD-10-CM

## 2019-06-28 LAB — NOVEL CORONAVIRUS, NAA: SARS-CoV-2, NAA: NOT DETECTED

## 2019-07-07 ENCOUNTER — Encounter (INDEPENDENT_AMBULATORY_CARE_PROVIDER_SITE_OTHER): Payer: BC Managed Care – PPO | Admitting: Ophthalmology

## 2019-10-14 ENCOUNTER — Ambulatory Visit: Payer: BC Managed Care – PPO | Admitting: Podiatry

## 2019-11-03 DIAGNOSIS — H2513 Age-related nuclear cataract, bilateral: Secondary | ICD-10-CM | POA: Insufficient documentation

## 2019-11-03 DIAGNOSIS — E11311 Type 2 diabetes mellitus with unspecified diabetic retinopathy with macular edema: Secondary | ICD-10-CM | POA: Insufficient documentation

## 2019-11-03 DIAGNOSIS — H401131 Primary open-angle glaucoma, bilateral, mild stage: Secondary | ICD-10-CM | POA: Insufficient documentation

## 2019-11-03 DIAGNOSIS — E113292 Type 2 diabetes mellitus with mild nonproliferative diabetic retinopathy without macular edema, left eye: Secondary | ICD-10-CM | POA: Insufficient documentation

## 2019-11-03 DIAGNOSIS — E113211 Type 2 diabetes mellitus with mild nonproliferative diabetic retinopathy with macular edema, right eye: Secondary | ICD-10-CM | POA: Insufficient documentation

## 2019-11-17 DIAGNOSIS — D259 Leiomyoma of uterus, unspecified: Secondary | ICD-10-CM | POA: Insufficient documentation

## 2019-11-17 DIAGNOSIS — N950 Postmenopausal bleeding: Secondary | ICD-10-CM | POA: Insufficient documentation

## 2020-03-17 ENCOUNTER — Other Ambulatory Visit: Payer: Self-pay | Admitting: Nurse Practitioner

## 2020-06-24 DIAGNOSIS — I10 Essential (primary) hypertension: Secondary | ICD-10-CM | POA: Insufficient documentation

## 2020-06-24 DIAGNOSIS — E7800 Pure hypercholesterolemia, unspecified: Secondary | ICD-10-CM | POA: Insufficient documentation

## 2022-01-26 DIAGNOSIS — M160 Bilateral primary osteoarthritis of hip: Secondary | ICD-10-CM | POA: Insufficient documentation

## 2022-03-06 DIAGNOSIS — S63259A Unspecified dislocation of unspecified finger, initial encounter: Secondary | ICD-10-CM | POA: Insufficient documentation

## 2022-08-01 DIAGNOSIS — H11823 Conjunctivochalasis, bilateral: Secondary | ICD-10-CM | POA: Insufficient documentation

## 2022-08-01 DIAGNOSIS — Z961 Presence of intraocular lens: Secondary | ICD-10-CM | POA: Insufficient documentation

## 2022-08-01 DIAGNOSIS — Z9889 Other specified postprocedural states: Secondary | ICD-10-CM | POA: Insufficient documentation

## 2022-09-07 DIAGNOSIS — S88911A Complete traumatic amputation of right lower leg, level unspecified, initial encounter: Secondary | ICD-10-CM | POA: Insufficient documentation

## 2022-11-14 DIAGNOSIS — E113411 Type 2 diabetes mellitus with severe nonproliferative diabetic retinopathy with macular edema, right eye: Secondary | ICD-10-CM | POA: Insufficient documentation

## 2022-11-14 DIAGNOSIS — E113492 Type 2 diabetes mellitus with severe nonproliferative diabetic retinopathy without macular edema, left eye: Secondary | ICD-10-CM | POA: Insufficient documentation

## 2023-01-10 ENCOUNTER — Encounter: Payer: Self-pay | Admitting: Podiatry

## 2023-01-10 ENCOUNTER — Ambulatory Visit: Payer: Medicare HMO | Admitting: Podiatry

## 2023-01-10 DIAGNOSIS — B351 Tinea unguium: Secondary | ICD-10-CM | POA: Diagnosis not present

## 2023-01-10 DIAGNOSIS — L84 Corns and callosities: Secondary | ICD-10-CM

## 2023-01-10 DIAGNOSIS — E1140 Type 2 diabetes mellitus with diabetic neuropathy, unspecified: Secondary | ICD-10-CM

## 2023-01-10 DIAGNOSIS — E1149 Type 2 diabetes mellitus with other diabetic neurological complication: Secondary | ICD-10-CM

## 2023-01-10 DIAGNOSIS — M79675 Pain in left toe(s): Secondary | ICD-10-CM | POA: Diagnosis not present
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
## 2023-08-14 ENCOUNTER — Ambulatory Visit: Admitting: Podiatry

## 2023-08-14 ENCOUNTER — Encounter: Payer: Self-pay | Admitting: Podiatry

## 2023-08-14 DIAGNOSIS — M79675 Pain in left toe(s): Secondary | ICD-10-CM

## 2023-08-14 DIAGNOSIS — E1149 Type 2 diabetes mellitus with other diabetic neurological complication: Secondary | ICD-10-CM | POA: Diagnosis not present

## 2023-08-14 DIAGNOSIS — B351 Tinea unguium: Secondary | ICD-10-CM

## 2023-08-14 DIAGNOSIS — E1140 Type 2 diabetes mellitus with diabetic neuropathy, unspecified: Secondary | ICD-10-CM | POA: Diagnosis not present

## 2023-08-14 DIAGNOSIS — L84 Corns and callosities: Secondary | ICD-10-CM | POA: Diagnosis not present

## 2023-08-15 NOTE — Progress Notes (Signed)
 Subjective:   Patient ID: Heather Bowen, female   DOB: 68 y.o.   MRN: [id number]   HPI Patient presents with chronic nail disease 1-5 both feet that become painful thick history of obesity with diabetic condition with moderate neuropathic symptoms noted and lesions third digit bilateral distal   ROS      Objective:  Physical Exam   vascular status intact neurologically diminishment sharp dull vibratory bilateral with the patient noted to have thick lesions digit 3 both feet painful when pressed and is noted to have elongated thickened nailbeds 1-5 both feet that are painful when pressed and make shoe gear difficult.  Patient does try to take care of herself to the best of her ability     Assessment:  Moderate diabetic neuropathic symptoms with patient's A1c in a much better area now with thick yellow brittle toenail deformity and lesions third bilateral     Plan:  H&P reviewed I went ahead debrided nailbeds 1-5 both feet and lesions third digit bilateral no iatrogenic bleeding and reviewed her continued good care of her diabetes and keeping her A1c good level and daily inspections of her feet
